# Patient Record
Sex: Male | Born: 1990 | Race: White | Hispanic: No | Marital: Married | Smoking: Current some day smoker
Health system: Southern US, Community
[De-identification: ages and names within clinical notes are randomized; demographics above are authoritative.]

---

## 2019-09-05 ENCOUNTER — Encounter (HOSPITAL_COMMUNITY)
Admission: EM | Disposition: A | Discharge status: Home / Self Care | Payer: Self-pay | Source: Home / Self Care | Attending: Emergency Medicine

## 2019-09-05 ENCOUNTER — Other Ambulatory Visit: Payer: Self-pay

## 2019-09-05 ENCOUNTER — Emergency Department (HOSPITAL_COMMUNITY): Payer: No Typology Code available for payment source | Admitting: Certified Registered Nurse Anesthetist

## 2019-09-05 ENCOUNTER — Ambulatory Visit (HOSPITAL_COMMUNITY)
Admission: EM | Admit: 2019-09-05 | Discharge: 2019-09-05 | Disposition: A | Discharge status: Home / Self Care | Payer: No Typology Code available for payment source | Attending: Emergency Medicine | Admitting: Emergency Medicine

## 2019-09-05 ENCOUNTER — Emergency Department (HOSPITAL_COMMUNITY): Payer: No Typology Code available for payment source

## 2019-09-05 ENCOUNTER — Encounter (HOSPITAL_COMMUNITY): Payer: Self-pay

## 2019-09-05 DIAGNOSIS — S0532XA Ocular laceration without prolapse or loss of intraocular tissue, left eye, initial encounter: Secondary | ICD-10-CM

## 2019-09-05 DIAGNOSIS — S0522XA Ocular laceration and rupture with prolapse or loss of intraocular tissue, left eye, initial encounter: Secondary | ICD-10-CM | POA: Diagnosis not present

## 2019-09-05 DIAGNOSIS — W25XXXA Contact with sharp glass, initial encounter: Secondary | ICD-10-CM | POA: Diagnosis not present

## 2019-09-05 DIAGNOSIS — F17200 Nicotine dependence, unspecified, uncomplicated: Secondary | ICD-10-CM | POA: Diagnosis not present

## 2019-09-05 DIAGNOSIS — Z20822 Contact with and (suspected) exposure to covid-19: Secondary | ICD-10-CM | POA: Diagnosis not present

## 2019-09-05 HISTORY — PX: RUPTURED GLOBE EXPLORATION AND REPAIR: SHX2366

## 2019-09-05 HISTORY — PX: ANTERIOR VITRECTOMY: SHX1173

## 2019-09-05 LAB — RESPIRATORY PANEL BY RT PCR (FLU A&B, COVID)
Influenza A by PCR: NEGATIVE
Influenza B by PCR: NEGATIVE
SARS Coronavirus 2 by RT PCR: NEGATIVE

## 2019-09-05 LAB — POC SARS CORONAVIRUS 2 AG -  ED: SARS Coronavirus 2 Ag: NEGATIVE

## 2019-09-05 SURGERY — REPAIR, RUPTURE, GLOBE
Anesthesia: General | Site: Eye | Laterality: Left

## 2019-09-05 MED ORDER — SODIUM CHLORIDE (PF) 0.9 % IJ SOLN
INTRAMUSCULAR | Status: AC
  Filled 2019-09-05: qty 10

## 2019-09-05 MED ORDER — ACETAMINOPHEN 500 MG PO TABS
1000.0000 mg | ORAL_TABLET | Freq: Once | ORAL | Status: AC
  Administered 2019-09-05: 16:00:00 1000 mg via ORAL
  Filled 2019-09-05: qty 2

## 2019-09-05 MED ORDER — FENTANYL CITRATE (PF) 100 MCG/2ML IJ SOLN: 25.0000 ug | INTRAMUSCULAR | Status: DC | PRN

## 2019-09-05 MED ORDER — LIDOCAINE 2% (20 MG/ML) 5 ML SYRINGE
INTRAMUSCULAR | Status: DC | PRN
  Administered 2019-09-05: 50 mg via INTRAVENOUS

## 2019-09-05 MED ORDER — FENTANYL CITRATE (PF) 100 MCG/2ML IJ SOLN
INTRAMUSCULAR | Status: DC | PRN
  Administered 2019-09-05 (×2): 50 ug via INTRAVENOUS
  Administered 2019-09-05: 100 ug via INTRAVENOUS
  Administered 2019-09-05: 50 ug via INTRAVENOUS

## 2019-09-05 MED ORDER — BSS PLUS IO SOLN
INTRAOCULAR | Status: AC
  Filled 2019-09-05: qty 500

## 2019-09-05 MED ORDER — FLUORESCEIN SODIUM 1 MG OP STRP
1.0000 | ORAL_STRIP | Freq: Once | OPHTHALMIC | Status: AC
  Administered 2019-09-05: 12:00:00 1 via OPHTHALMIC
  Filled 2019-09-05: qty 1

## 2019-09-05 MED ORDER — LIDOCAINE 2% (20 MG/ML) 5 ML SYRINGE
INTRAMUSCULAR | Status: AC
  Filled 2019-09-05: qty 5

## 2019-09-05 MED ORDER — NA CHONDROIT SULF-NA HYALURON 40-30 MG/ML IO SOLN
INTRAOCULAR | Status: DC | PRN
  Administered 2019-09-05: 0.5 mL via INTRAOCULAR

## 2019-09-05 MED ORDER — BSS IO SOLN
INTRAOCULAR | Status: AC
  Filled 2019-09-05: qty 30

## 2019-09-05 MED ORDER — BSS PLUS IO SOLN
INTRAOCULAR | Status: DC | PRN
  Administered 2019-09-05: 1 via INTRAOCULAR

## 2019-09-05 MED ORDER — TETRACAINE HCL 0.5 % OP SOLN
2.0000 [drp] | Freq: Once | OPHTHALMIC | Status: AC
  Administered 2019-09-05: 2 [drp] via OPHTHALMIC
  Filled 2019-09-05: qty 4

## 2019-09-05 MED ORDER — FENTANYL CITRATE (PF) 250 MCG/5ML IJ SOLN
INTRAMUSCULAR | Status: AC
  Filled 2019-09-05: qty 5

## 2019-09-05 MED ORDER — HYDROCODONE-ACETAMINOPHEN 5-325 MG PO TABS: 1.0000 | ORAL_TABLET | Freq: Four times a day (QID) | ORAL | 0 refills | Status: AC | PRN

## 2019-09-05 MED ORDER — EPINEPHRINE PF 1 MG/ML IJ SOLN
INTRAMUSCULAR | Status: AC
  Filled 2019-09-05: qty 1

## 2019-09-05 MED ORDER — LIDOCAINE HCL 2 % IJ SOLN
INTRAMUSCULAR | Status: AC
  Filled 2019-09-05: qty 20

## 2019-09-05 MED ORDER — ACETYLCHOLINE CHLORIDE 20 MG IO SOLR
INTRAOCULAR | Status: DC | PRN
  Administered 2019-09-05: 10 mg via INTRAOCULAR

## 2019-09-05 MED ORDER — BSS IO SOLN
INTRAOCULAR | Status: AC
  Filled 2019-09-05: qty 15

## 2019-09-05 MED ORDER — STERILE WATER FOR IRRIGATION IR SOLN
Status: DC | PRN
  Administered 2019-09-05: 1000 mL

## 2019-09-05 MED ORDER — SODIUM CHLORIDE 0.9 % IV SOLN: INTRAVENOUS | Status: DC

## 2019-09-05 MED ORDER — DEXAMETHASONE SODIUM PHOSPHATE 10 MG/ML IJ SOLN
INTRAMUSCULAR | Status: DC | PRN
  Administered 2019-09-05: 10 mg via INTRAVENOUS

## 2019-09-05 MED ORDER — CIPROFLOXACIN IN D5W 400 MG/200ML IV SOLN
400.0000 mg | Freq: Once | INTRAVENOUS | Status: AC
  Administered 2019-09-05: 14:00:00 400 mg via INTRAVENOUS
  Filled 2019-09-05: qty 200

## 2019-09-05 MED ORDER — HYPROMELLOSE (GONIOSCOPIC) 2.5 % OP SOLN
OPHTHALMIC | Status: AC
  Filled 2019-09-05: qty 15

## 2019-09-05 MED ORDER — PROVISC 10 MG/ML IO SOLN
INTRAOCULAR | Status: DC | PRN
  Administered 2019-09-05: .85 mL via INTRAOCULAR

## 2019-09-05 MED ORDER — ACETAZOLAMIDE SODIUM 500 MG IJ SOLR
INTRAMUSCULAR | Status: AC
  Filled 2019-09-05: qty 500

## 2019-09-05 MED ORDER — DEXAMETHASONE SODIUM PHOSPHATE 10 MG/ML IJ SOLN
INTRAMUSCULAR | Status: AC
  Filled 2019-09-05: qty 1

## 2019-09-05 MED ORDER — ACETYLCHOLINE CHLORIDE 20 MG IO SOLR
INTRAOCULAR | Status: AC
  Filled 2019-09-05: qty 1

## 2019-09-05 MED ORDER — MIDAZOLAM HCL 2 MG/2ML IJ SOLN
INTRAMUSCULAR | Status: DC | PRN
  Administered 2019-09-05: 2 mg via INTRAVENOUS

## 2019-09-05 MED ORDER — ONDANSETRON HCL 4 MG/2ML IJ SOLN
INTRAMUSCULAR | Status: DC | PRN
  Administered 2019-09-05: 4 mg via INTRAVENOUS

## 2019-09-05 MED ORDER — BACITRACIN-POLYMYXIN B 500-10000 UNIT/GM OP OINT
TOPICAL_OINTMENT | OPHTHALMIC | Status: AC
  Filled 2019-09-05: qty 3.5

## 2019-09-05 MED ORDER — MIDAZOLAM HCL 2 MG/2ML IJ SOLN
INTRAMUSCULAR | Status: AC
  Filled 2019-09-05: qty 2

## 2019-09-05 MED ORDER — CIPROFLOXACIN HCL 500 MG PO TABS: 500.0000 mg | ORAL_TABLET | Freq: Two times a day (BID) | ORAL | 0 refills | Status: AC

## 2019-09-05 MED ORDER — BUPIVACAINE HCL (PF) 0.75 % IJ SOLN
INTRAMUSCULAR | Status: AC
  Filled 2019-09-05: qty 10

## 2019-09-05 MED ORDER — SODIUM HYALURONATE 10 MG/ML IO SOLN
INTRAOCULAR | Status: AC
  Filled 2019-09-05: qty 0.85

## 2019-09-05 MED ORDER — PROPOFOL 10 MG/ML IV BOLUS
INTRAVENOUS | Status: DC | PRN
  Administered 2019-09-05: 160 mg via INTRAVENOUS
  Administered 2019-09-05: 20 mg via INTRAVENOUS

## 2019-09-05 MED ORDER — BSS IO SOLN
INTRAOCULAR | Status: DC | PRN
  Administered 2019-09-05 (×6): 15 mL via INTRAOCULAR

## 2019-09-05 MED ORDER — TRIAMCINOLONE ACETONIDE 40 MG/ML IJ SUSP
INTRAMUSCULAR | Status: AC
  Filled 2019-09-05: qty 5

## 2019-09-05 MED ORDER — PROPOFOL 10 MG/ML IV BOLUS
INTRAVENOUS | Status: AC
  Filled 2019-09-05: qty 20

## 2019-09-05 MED ORDER — CEFTAZIDIME 1 G IJ SOLR
INTRAMUSCULAR | Status: AC
  Filled 2019-09-05: qty 1

## 2019-09-05 MED ORDER — ROCURONIUM BROMIDE 10 MG/ML (PF) SYRINGE
PREFILLED_SYRINGE | INTRAVENOUS | Status: DC | PRN
  Administered 2019-09-05 (×2): 20 mg via INTRAVENOUS
  Administered 2019-09-05: 70 mg via INTRAVENOUS
  Administered 2019-09-05 (×2): 10 mg via INTRAVENOUS

## 2019-09-05 MED ORDER — SUGAMMADEX SODIUM 200 MG/2ML IV SOLN
INTRAVENOUS | Status: DC | PRN
  Administered 2019-09-05: 200 mg via INTRAVENOUS

## 2019-09-05 MED ORDER — NA CHONDROIT SULF-NA HYALURON 40-30 MG/ML IO SOLN
INTRAOCULAR | Status: AC
  Filled 2019-09-05: qty 0.5

## 2019-09-05 MED ORDER — ROCURONIUM BROMIDE 10 MG/ML (PF) SYRINGE
PREFILLED_SYRINGE | INTRAVENOUS | Status: AC
  Filled 2019-09-05: qty 10

## 2019-09-05 MED ORDER — MOXIFLOXACIN HCL 0.5 % OP SOLN: 1.0000 [drp] | Freq: Four times a day (QID) | OPHTHALMIC | 0 refills | Status: AC

## 2019-09-05 MED ORDER — TOBRAMYCIN-DEXAMETHASONE 0.3-0.1 % OP SUSP
OPHTHALMIC | Status: AC
  Filled 2019-09-05: qty 2.5

## 2019-09-05 MED ORDER — STERILE WATER FOR INJECTION IJ SOLN
INTRAMUSCULAR | Status: AC
  Filled 2019-09-05: qty 20

## 2019-09-05 MED ORDER — PREDNISOLONE ACETATE 1 % OP SUSP: 1.0000 [drp] | Freq: Four times a day (QID) | OPHTHALMIC | 0 refills | Status: AC

## 2019-09-05 MED ORDER — TOBRAMYCIN-DEXAMETHASONE 0.3-0.1 % OP SUSP
OPHTHALMIC | Status: DC | PRN
  Administered 2019-09-05: 1 [drp] via OPHTHALMIC

## 2019-09-05 MED ORDER — IOHEXOL 300 MG/ML  SOLN
75.0000 mL | Freq: Once | INTRAMUSCULAR | Status: AC | PRN
  Administered 2019-09-05: 13:00:00 75 mL via INTRAVENOUS

## 2019-09-05 MED ORDER — HYALURONIDASE HUMAN 150 UNIT/ML IJ SOLN
INTRAMUSCULAR | Status: AC
  Filled 2019-09-05: qty 1

## 2019-09-05 MED ORDER — POLYMYXIN B SULFATE 500000 UNITS IJ SOLR
INTRAMUSCULAR | Status: AC
  Filled 2019-09-05: qty 500000

## 2019-09-05 MED ORDER — ATROPINE SULFATE 1 % OP SOLN
OPHTHALMIC | Status: AC
  Filled 2019-09-05: qty 5

## 2019-09-05 SURGICAL SUPPLY — 86 items
ACCESSORY FRAGMATOME (MISCELLANEOUS) IMPLANT
APPLICATOR DR MATTHEWS STRL (MISCELLANEOUS) ×3 IMPLANT
BAG URINE DRAINAGE (UROLOGICAL SUPPLIES) IMPLANT
BAND WRIST GAS GREEN (MISCELLANEOUS) IMPLANT
BETADINE 5% OPHTHALMIC (OPHTHALMIC) ×1 IMPLANT
BLADE EYE CATARACT 19 1.4 BEAV (BLADE) IMPLANT
BLADE MVR KNIFE 19G (BLADE) IMPLANT
BLADE MVR KNIFE 20G (BLADE) ×3 IMPLANT
BLADE SURG 15 STRL LF DISP TIS (BLADE) IMPLANT
BLADE SURG 15 STRL SS (BLADE)
BNDG EYE OVAL (GAUZE/BANDAGES/DRESSINGS) ×3 IMPLANT
CANNULA ANT CHAM MAIN (OPHTHALMIC RELATED) ×3 IMPLANT
CANNULA ANTERIOR CHAMBER 27GA (MISCELLANEOUS) ×3 IMPLANT
CANNULA SUBRETINAL FLUID 20G (BLADE) IMPLANT
CANNULA TROCAR 25 GA VLV (OPHTHALMIC) ×3 IMPLANT
CATH FOLEY 2WAY SLVR  5CC 16FR (CATHETERS)
CATH FOLEY 2WAY SLVR 5CC 16FR (CATHETERS) IMPLANT
CLOSURE WOUND 1/2 X4 (GAUZE/BANDAGES/DRESSINGS) ×1
CORD BIPOLAR FORCEPS 12FT (ELECTRODE) IMPLANT
COTTONBALL LRG STERILE PKG (GAUZE/BANDAGES/DRESSINGS) IMPLANT
COVER MAYO STAND STRL (DRAPES) IMPLANT
COVER SURGICAL LIGHT HANDLE (MISCELLANEOUS) ×3 IMPLANT
COVER WAND RF STERILE (DRAPES) IMPLANT
DRAPE OPHTHALMIC 77X100 STRL (CUSTOM PROCEDURE TRAY) ×3 IMPLANT
ERASER HMR WETFIELD 23G BP (MISCELLANEOUS) IMPLANT
FILTER BLUE MILLIPORE (MISCELLANEOUS) IMPLANT
GAS OPHTHALMIC (MISCELLANEOUS) IMPLANT
GAS WRIST BAND GREEN (MISCELLANEOUS)
GLOVE SS BIOGEL STRL SZ 6.5 (GLOVE) ×1 IMPLANT
GLOVE SUPERSENSE BIOGEL SZ 6.5 (GLOVE) ×2
GLOVE SURG 8.5 LATEX PF (GLOVE) ×3 IMPLANT
GOWN STRL REUS W/ TWL LRG LVL3 (GOWN DISPOSABLE) ×3 IMPLANT
GOWN STRL REUS W/TWL LRG LVL3 (GOWN DISPOSABLE) ×6
KIT BASIN OR (CUSTOM PROCEDURE TRAY) ×3 IMPLANT
KIT PERFLUORON PROCEDURE 5ML (MISCELLANEOUS) IMPLANT
KIT TURNOVER KIT B (KITS) ×3 IMPLANT
KNIFE CRESCENT 1.75 EDGEAHEAD (BLADE) IMPLANT
KNIFE GRIESHABER SHARP 2.5MM (MISCELLANEOUS) IMPLANT
MARKER SKIN DUAL TIP RULER LAB (MISCELLANEOUS) IMPLANT
NEEDLE 18GX1X1/2 (RX/OR ONLY) (NEEDLE) ×3 IMPLANT
NEEDLE 25GX 5/8IN NON SAFETY (NEEDLE) ×3 IMPLANT
NEEDLE HYPO 30X.5 LL (NEEDLE) ×3 IMPLANT
NEEDLE PRECISIONGLIDE 27X1.5 (NEEDLE) IMPLANT
NS IRRIG 1000ML POUR BTL (IV SOLUTION) ×3 IMPLANT
OPHTHALMIC BETADINE 5% (OPHTHALMIC) ×2
PACK VITRECTOMY CUSTOM (CUSTOM PROCEDURE TRAY) ×3 IMPLANT
PACK VITRECTOMY PIC MCHSVP (PACKS) IMPLANT
PAD ARMBOARD 7.5X6 YLW CONV (MISCELLANEOUS) ×6 IMPLANT
PAK PIK VITRECTOMY CVS 25GA (OPHTHALMIC) ×3 IMPLANT
PROBE ANTERIOR VITRECTOR (OPHTHALMIC) ×3 IMPLANT
PROBE CONSTELLATION 25 GA PLUS (OPHTHALMIC) IMPLANT
PROBE LASER 20G CVD ENDOCULAR (MISCELLANEOUS) IMPLANT
PROBE LASER LIGHT 20GA (MISCELLANEOUS) IMPLANT
REPL STRA BRUSH NEEDLE (NEEDLE) IMPLANT
RESERVOIR BACK FLUSH (MISCELLANEOUS) IMPLANT
ROLLS DENTAL (MISCELLANEOUS) ×6 IMPLANT
Resure Sealant ×6 IMPLANT
SCRAPER DIAMOND DUST MEMBRANE (MISCELLANEOUS) IMPLANT
SET VGFI TUBING 8065808002 (SET/KITS/TRAYS/PACK) IMPLANT
SHIELD EYE LENSE ONLY DISP (GAUZE/BANDAGES/DRESSINGS) ×3 IMPLANT
SPEAR EYE SURG WECK-CEL (MISCELLANEOUS) ×15 IMPLANT
STOPCOCK 4 WAY LG BORE MALE ST (IV SETS) IMPLANT
STRIP CLOSURE SKIN 1/2X4 (GAUZE/BANDAGES/DRESSINGS) ×2 IMPLANT
SUT CHROMIC 7 0 TG140 8 (SUTURE) IMPLANT
SUT ETHILON 10 0 BV100 4 (SUTURE) IMPLANT
SUT ETHILON 10 0 BV75 4 (SUTURE) IMPLANT
SUT ETHILON 10 0 CS140 6 (SUTURE) ×6 IMPLANT
SUT ETHILON 8 0 TG100 8 (SUTURE) ×3 IMPLANT
SUT ETHILON 9 0 TG140 8 (SUTURE) ×3 IMPLANT
SUT SILK 2 0 (SUTURE)
SUT SILK 2-0 18XBRD TIE 12 (SUTURE) IMPLANT
SUT VICRYL 7 0 TG140 8 (SUTURE) IMPLANT
SWAB COLLECTION DEVICE MRSA (MISCELLANEOUS) IMPLANT
SWAB CULTURE ESWAB REG 1ML (MISCELLANEOUS) IMPLANT
SYR 10ML LL (SYRINGE) IMPLANT
SYR 20ML LL LF (SYRINGE) ×3 IMPLANT
SYR 50ML SLIP (SYRINGE) IMPLANT
SYR 5ML LL (SYRINGE) IMPLANT
SYR BULB 3OZ (MISCELLANEOUS) ×3 IMPLANT
SYR CONTROL 10ML LL (SYRINGE) ×3 IMPLANT
SYR TB 1ML LUER SLIP (SYRINGE) ×3 IMPLANT
TOWEL GREEN STERILE FF (TOWEL DISPOSABLE) ×9 IMPLANT
TUBING HIGH PRESS EXTEN 6IN (TUBING) IMPLANT
VITREORETINAL VISCODISSEC (MISCELLANEOUS) IMPLANT
WATER STERILE IRR 1000ML POUR (IV SOLUTION) ×3 IMPLANT
WIPE INSTRUMENT VISIWIPE 73X73 (MISCELLANEOUS) ×3 IMPLANT

## 2019-09-05 NOTE — ED Provider Notes (Signed)
Nathaniel Briggs is a 29 y.o. male who was transferred from [facility] for evaluation of possible globe rupture after patient got shattered glass in his eye.  Please see Dr. Christoper Fabian note for full details.  Dr. Cathey Endow with ophthalmology was consulted by Dr. Fredderick Phenix, he requests that the patient have CT of the orbits once he has arrived here at [facility].   Upon arrival to the department Dr. Cathey Endow is already at bedside speaking with patient.  Dr. Cathey Endow plans to take patient to the OR, surgery is scheduled for 4 PM.  In the meantime he would like patient to receive IV antibiotics and request that the patient be prescribed outpatient oral antibiotics as well as Vigamox drops, Pred forte drops, and pain medication.    Initially Dr. Cathey Endow had asked for IV and oral moxifloxacin but this is not on formulary here at the hospital, spoke with Dr. Cathey Endow again and he is okay with using IV and oral Cipro.  Prescriptions have been sent into the patient's pharmacy and will be ready for him for discharge after surgery.  CT ORBITS W CONTRAST  Result Date: 09/05/2019 CLINICAL DATA:  Left orbital injury.  Rule out glass foreign body EXAM: CT ORBITS WITH CONTRAST TECHNIQUE: Multidetector CT images was performed according to the standard protocol following intravenous contrast administration. CONTRAST:  65mL OMNIPAQUE IOHEXOL 300 MG/ML  SOLN COMPARISON:  None. FINDINGS: Orbits: No radiopaque glass foreign body in the orbit. There is a small gas bubble along the lateral cornea on the left which could be a corneal laceration. The lens is normal in position bilaterally. The posterior chamber is normal without hemorrhage. No globe rupture identified. Orbital soft tissues otherwise normal. Visualized sinuses: Clear Soft tissues: No significant soft tissue swelling or foreign body in the face. Limited intracranial: Negative IMPRESSION: No radiopaque foreign body in the orbit or soft tissues Small gas bubble along the anterior cornea  laterally on the left which could represent a corneal laceration. Electronically Signed   By: Marlan Palau M.D.   On: 09/05/2019 12:59   CT scan without evidence of foreign body, small gas bubble along the anterior cornea laterally which likely represents a corneal laceration, no obvious evidence of globe rupture on CT.  Patient comfortable while waiting to go to the OR at 4 PM with Dr. Cathey Endow.  He will be discharged from postop.  Final diagnoses:  Ruptured globe of left eye, initial encounter    ED Discharge Orders         Ordered    moxifloxacin (VIGAMOX) 0.5 % ophthalmic solution  4 times daily     09/05/19 1320    prednisoLONE acetate (PRED FORTE) 1 % ophthalmic suspension  4 times daily     09/05/19 1320    ciprofloxacin (CIPRO) 500 MG tablet  2 times daily     09/05/19 1320    HYDROcodone-acetaminophen (NORCO) 5-325 MG tablet  Every 6 hours PRN     09/05/19 1320            Dartha Lodge, [place] 09/05/19 1500    Jacalyn Lefevre, MD 09/11/19 1201

## 2019-09-05 NOTE — ED Notes (Signed)
Visual acuity R eye 20/25, patient does have contact in. Unable to read any numbers with L eye, states he took his contact out, everything is blurry.

## 2019-09-05 NOTE — Brief Op Note (Signed)
09/05/2019  7:24 PM  PATIENT:  Nathaniel Briggs  29 y.o. male  PRE-OPERATIVE DIAGNOSIS:  ruptured globe  POST-OPERATIVE DIAGNOSIS:  ruptured globe  PROCEDURE:  Procedure(s): REPAIR OF RUPTURED GLOBE (Left) Anterior Vitrectomy (Left)  SURGEON:  Surgeon(s) and Role:    Sinda Du, MD - Primary  PHYSICIAN ASSISTANT:   ASSISTANTS: none   ANESTHESIA:   general  EBL:  None   BLOOD ADMINISTERED:none  DRAINS: none   LOCAL MEDICATIONS USED:  LIDOCAINE   SPECIMEN:  No Specimen  DISPOSITION OF SPECIMEN:  N/A  COUNTS:  YES  TOURNIQUET:  * No tourniquets in log *  DICTATION: .Note written in EPIC  PLAN OF CARE: Discharge to home after PACU  PATIENT DISPOSITION:  PACU - hemodynamically stable.   Delay start of Pharmacological VTE agent (>24hrs) due to surgical blood loss or risk of bleeding: not applicable

## 2019-09-05 NOTE — H&P (Addendum)
OPHTHALMOLOGY CONSULT NOTE  Date: 09/05/19 Time: 12:32 PM  Patient Name: Nathaniel Briggs  DOB: 03/07/1991 MRN: [id number]  Reason for Consult:  Glass in eye os  HPI: This is a 29 y.o. male who was moving an entertainment center earlier today when a glass screen broke and hit him in the left eye. The patient noted an immidiate reduction in his vision and presented to [facility]. After reviewing photos, patient was transferred to [facility] for further evaluation and surgery.   Prior to Admission medications   Not on File    No past medical history on file.  family history is not on file.  Social History   Occupational History  . Not on file  Tobacco Use  . Smoking status: Not on file  Substance and Sexual Activity  . Alcohol use: Not on file  . Drug use: Not on file  . Sexual activity: Not on file    No Known Allergies  ROS: Positive as above, otherwise negative.  EXAM:  Mental Status: A&O x 3   Base Exam: Right Eye Left Eye  Visual Acuity (At near)  LP  IOP (Tonopen) 20/25 Defered  Pupillary Exam APD by reverse   Motility Full   Confrontation VF Full     Anterior Segment Exam    Lids/Lashes WNL WNL  Conjuctiva White and Quiet Temporal conjuctival laceration with iris prolapse  Cornea Clear Clear  Anterior Chamber Deep and Quiet Total hyphema iris peaked temporally  Iris Round, Reactive As above  Lens Clear No view  Vitreous WNL    Physical exam: Pulm: Normal excursion Cardio: RRR Abdomen: Soft, NT Neuro: Non focal   Radiographic Studies Reviewed: None No results found.  Assessment and Recommendation: 1. Ruptured globe with iris prolapse: Rec ruptured globe repair after RBAI discussed.   -Give 400mg  IV Moxifloxacin in ER  discharge with QID Moxifloxacin ophthalmic, pred forte 1% qid, as well as 400mg  po Moxifloxacin qd x 10 days. Ok for cipro if not Moxifloxacin.   -shield over eye   Please call with any questions.  MD [facility]  Ophthalmology [phone]

## 2019-09-05 NOTE — ED Provider Notes (Signed)
[facility]-EMERGENCY DEPT Provider Note   CSN: [id number] Arrival date & time: 09/05/19  1032     History No chief complaint on file.   Kelan Pritt is a 29 y.o. male.  Patient is a 29 year old male who presents after an eye injury.  He was moving a glass entertainment center that had black glass and when he laid the glass on the bed, it shattered and he felt like a piece went into his eye.  He did have a contact in which has subsequently been removed.  He said his vision is blurry in his left eye has been watering.  He has ongoing pain to his left eye.  He denies any other injuries.        No past medical history on file.  There are no problems to display for this patient.    The histories are not reviewed yet. Please review them in the "History" navigator section and refresh this SmartLink.     No family history on file.  Social History   Tobacco Use  . Smoking status: Not on file  Substance Use Topics  . Alcohol use: Not on file  . Drug use: Not on file    Home Medications Prior to Admission medications   Not on File    Allergies    Patient has no known allergies.  Review of Systems   Review of Systems  Constitutional: Negative for fever.  HENT: Negative.   Eyes: Positive for pain, discharge and visual disturbance.  Respiratory: Negative for shortness of breath.   Gastrointestinal: Negative for nausea and vomiting.  Genitourinary: Negative.   Musculoskeletal: Negative for arthralgias, myalgias and neck pain.  Skin: Positive for wound.  Neurological: Negative for headaches.  Psychiatric/Behavioral: Negative for confusion.    Physical Exam Updated Vital Signs BP (!) 156/110 (BP Location: Left Arm)   Pulse 82   Temp 98.6 F (37 C) (Oral)   Resp 16   Ht 6' (1.829 m)   Wt 113.4 kg   SpO2 98%   BMI 33.91 kg/m   Physical Exam HENT:     Head: Normocephalic and atraumatic.     Comments: Small superficial abrasion to the  skin just lateral to the left eye    Nose: Nose normal.  Eyes:     Extraocular Movements: Extraocular movements intact.     Pupils: Pupils are equal, round, and reactive to light.     Comments: Patient has a small puncture/laceration to the sclera at the 3 o'clock position of the left eye, just lateral to the cornea.  There is a small hematoma present.  As I was trying to assess whether there is a piece of glass in the wound, it does seem to reaccumulate with fluid, concerning for a deeper puncture into the globe  Cardiovascular:     Rate and Rhythm: Normal rate.  Pulmonary:     Effort: Pulmonary effort is normal.  Abdominal:     General: Abdomen is flat.     Tenderness: There is no abdominal tenderness.  Musculoskeletal:        General: Normal range of motion.     Cervical back: Neck supple.  Skin:    General: Skin is warm and dry.  Neurological:     General: No focal deficit present.     Mental Status: He is alert.     ED Results / Procedures / Treatments   Labs (all labs ordered are listed, but only abnormal results are  displayed) Labs Reviewed  RESPIRATORY PANEL BY RT PCR (FLU A&B, COVID)    EKG None  Radiology No results found.  Procedures Procedures (including critical care time)  Medications Ordered in ED Medications  fluorescein ophthalmic strip 1 strip (1 strip Left Eye Given 09/05/19 1132)  tetracaine (PONTOCAINE) 0.5 % ophthalmic solution 2 drop (2 drops Left Eye Given 09/05/19 1132)    ED Course  I have reviewed the triage vital signs and the nursing notes.  Pertinent labs & imaging results that were available during my care of the patient were reviewed by me and considered in my medical decision making (see chart for details).    MDM Rules/Calculators/A&P                      Patient is a 29 year old male who presents with concern for globe rupture. I spoke with Dr. Valetta Close with ophthalmology who has seen the picture of the eye and also was concerned  about a globe rupture. He request emergent transfer to [facility] emergency department where he probably will need operative management. He also request a CT of the orbits although he wants this to be done at [facility] versus [facility]. An eye protector was placed over the eye. 2 hour covid test pending.  Pt's manager to drive pt to [facility] ED.  I spoke to Dr. Jeanell Sparrow who also knows about patient. Patient was advised not to eat or drink anything and to go directly to the [facility] emergency department.  CRITICAL CARE Performed by: Malvin Johns Total critical care time: 30 minutes Critical care time was exclusive of separately billable procedures and treating other patients. Critical care was necessary to treat or prevent imminent or life-threatening deterioration. Critical care was time spent personally by me on the following activities: development of treatment plan with patient and/or surrogate as well as nursing, discussions with consultants, evaluation of patient's response to treatment, examination of patient, obtaining history from patient or surrogate, ordering and performing treatments and interventions, ordering and review of laboratory studies, ordering and review of radiographic studies, pulse oximetry and re-evaluation of patient's condition.  Final Clinical Impression(s) / ED Diagnoses Final diagnoses:  Ruptured globe of left eye, initial encounter    Rx / DC Orders ED Discharge Orders    None       Malvin Johns, MD 09/05/19 1154

## 2019-09-05 NOTE — ED Notes (Signed)
Consulting civil engineer at [employer] notified of patient's transfer.

## 2019-09-05 NOTE — Op Note (Signed)
Ophthalmology operative report: 09/05/2019  7:24 PM  PATIENT:  Nathaniel Briggs  29 y.o. male  PRE-OPERATIVE DIAGNOSIS:  ruptured globe  POST-OPERATIVE DIAGNOSIS:  ruptured globe  PROCEDURE:  Procedure(s): REPAIR OF RUPTURED GLOBE (Left) Anterior Vitrectomy (Left)  SURGEON:  Surgeon(s) and Role:    Sinda Du, MD - Primary  PHYSICIAN ASSISTANT:   ASSISTANTS: none   ANESTHESIA:   general  EBL:  None   BLOOD ADMINISTERED:none  DRAINS: none   LOCAL MEDICATIONS USED:  LIDOCAINE   SPECIMEN:  No Specimen  DISPOSITION OF SPECIMEN:  N/A  COUNTS:  YES  TOURNIQUET:  * No tourniquets in log *  DICTATION: .Note written in EPIC  PLAN OF CARE: Discharge to home after PACU  PATIENT DISPOSITION:  PACU - hemodynamically stable.   Delay start of Pharmacological VTE agent (>24hrs) due to surgical blood loss or risk of bleeding: not applicable         Description of Procedure:  After the risks benefits and alternative of surgery were discussed including guarded visual prognosis, risk of infection and likelihood of needing more surgery, the patient was taken to OR [address] where he was placed under general anesthesia. A time out was performed confirming the correct patient, correct eye and correct procedure. After induction the patient was rotated under the operating microscope and the prepped in the usual sterile ophthalmic fashion. Attention was turned to the eye and a paracentesis was created nasally. The patient had significant hyphema, but centrally visability was clear enough to allow for  Provisc to be injected into the eye and the iris sweeped from the wound. At that point a peritomy was created in the conjunctiva temporally to reveal the extent of the wound. The wound was C shaped and approximately 6 mm long. Despite viscoelastic Iris/uveal prolapse continued and the wound was closed gradually posterior to anterior to slowly close the gap and  limit prolapse. The iris required sweeping from the wound multiple times and Iris damage was noted despite care to avoid this. Despite this, The wound was gradually closed in interupted fashion using 9-0 nylon sutures and all attempts were made not to incarsorate the iris. Wound closure was completed and the wound was found to be water tight. Attention was turned to the anterior chamber and during hydration of the surgical incision vitreous was noted in [facility] given a clearer view after IA. Given this a limited anterior vitrectomy was performed to avoid vitreous incarceration. At this point all wounds were sutured as a second paracentesis was required at 10 oclock for the irrigation. Wounds were hydrated and all wounds including the rupture site were water tight with normal pressure. Resure ocular sealant was placed over the rupture site to ensure maintained closure. The peritomy was then closed. Tobradex was administered and  A patch and shield were placed on the eye.   The patient was discharged to pacu in stable condition and should start Oral ciprofloxacin, moxifloxacin and pred forte QID starting tonight as directed in the ED.

## 2019-09-05 NOTE — Transfer of Care (Signed)
Immediate Anesthesia Transfer of Care Note  Patient: Nathaniel Briggs  Procedure(s) Performed: REPAIR OF RUPTURED GLOBE (Left Eye) Anterior Vitrectomy (Left Eye)  Patient Location: PACU  Anesthesia Type:General  Level of Consciousness: drowsy and patient cooperative  Airway & Oxygen Therapy: Patient Spontanous Breathing  Post-op Assessment: Report given to RN and Post -op Vital signs reviewed and stable  Post vital signs: Reviewed and stable  Last Vitals:  Vitals Value Taken Time  BP    Temp    Pulse 94 09/05/19 1946  Resp 17 09/05/19 1946  SpO2 95 % 09/05/19 1946  Vitals shown include unvalidated device data.  Last Pain:  Vitals:   09/05/19 1556  TempSrc:   PainSc: 2       Patients Stated Pain Goal: 0 (09/05/19 1556)  Complications: No apparent anesthesia complications

## 2019-09-05 NOTE — Anesthesia Procedure Notes (Signed)
Procedure Name: Intubation Date/Time: 09/05/2019 4:23 PM Performed by: Barrington Ellison, CRNA Pre-anesthesia Checklist: Patient identified, Emergency Drugs available, Suction available and Patient being monitored Patient Re-evaluated:Patient Re-evaluated prior to induction Oxygen Delivery Method: Circle System Utilized Preoxygenation: Pre-oxygenation with 100% oxygen Induction Type: IV induction Ventilation: Mask ventilation without difficulty Laryngoscope Size: Mac and 4 Grade View: Grade I Tube type: Oral Tube size: 7.5 mm Number of attempts: 1 Airway Equipment and Method: Stylet and Oral airway Placement Confirmation: ETT inserted through vocal cords under direct vision,  positive ETCO2 and breath sounds checked- equal and bilateral Secured at: 23 cm Tube secured with: Tape Dental Injury: Teeth and Oropharynx as per pre-operative assessment

## 2019-09-05 NOTE — Consult Note (Signed)
OPHTHALMOLOGY CONSULT NOTE  Date: 09/05/19 Time: 12:32 PM  Patient Name: Nathaniel Briggs  DOB: Jan 17, 1991 MRN: [id number]  Reason for Consult:  Glass in eye os  HPI: This is a 29 y.o. male who was moving an entertainment center earlier today when a glass screen broke and hit him in the left eye. The patient noted an immidiate reduction in his vision and presented to [facility]. After reviewing photos, patient was transferred to [facility] for further evaluation and surgery.   Prior to Admission medications   Not on File    No past medical history on file.  family history is not on file.  Social History   Occupational History  . Not on file  Tobacco Use  . Smoking status: Not on file  Substance and Sexual Activity  . Alcohol use: Not on file  . Drug use: Not on file  . Sexual activity: Not on file    No Known Allergies  ROS: Positive as above, otherwise negative.  EXAM:  Mental Status: A&O x 3   Base Exam: Right Eye Left Eye  Visual Acuity (At near)  LP  IOP (Tonopen) 20/25 Defered  Pupillary Exam APD by reverse   Motility Full   Confrontation VF Full     Anterior Segment Exam    Lids/Lashes WNL WNL  Conjuctiva White and Quiet Temporal conjuctival laceration with iris prolapse  Cornea Clear Clear  Anterior Chamber Deep and Quiet Total hyphema iris peaked temporally  Iris Round, Reactive As above  Lens Clear No view  Vitreous WNL     Radiographic Studies Reviewed: None No results found.  Assessment and Recommendation: 1. Ruptured globe with iris prolapse: Rec ruptured globe repair after RBAI discussed.   -Give 400mg  IV Moxifloxacin in ER  discharge with QID Moxifloxacin ophthalmic as well as 400mg  po Moxifloxacin qd x 10 days.  -shield over eye   -GET ct orbits prior to surgery  Please call with any questions.  MD [facility] Ophthalmology [phone]

## 2019-09-05 NOTE — Anesthesia Preprocedure Evaluation (Addendum)
Anesthesia Evaluation  Patient identified by MRN, date of birth, ID band Patient awake    Reviewed: Allergy & Precautions, NPO status , Patient's Chart, lab work & pertinent test results  Airway Mallampati: II  TM Distance: >3 FB Neck ROM: Full    Dental  (+) Teeth Intact, Dental Advisory Given   Pulmonary Current Smoker and Patient abstained from smoking.,    breath sounds clear to auscultation       Cardiovascular negative cardio ROS   Rhythm:Regular Rate:Normal     Neuro/Psych negative neurological ROS  negative psych ROS   GI/Hepatic negative GI ROS, Neg liver ROS,   Endo/Other  negative endocrine ROS  Renal/GU negative Renal ROS  negative genitourinary   Musculoskeletal negative musculoskeletal ROS (+)   Abdominal Normal abdominal exam  (+)   Peds  Hematology negative hematology ROS (+)   Anesthesia Other Findings Ruptured globe OS from piece of glass  Reproductive/Obstetrics                            Anesthesia Physical Anesthesia Plan  ASA: I and emergent  Anesthesia Plan: General   Post-op Pain Management:    Induction: Intravenous  PONV Risk Score and Plan: 2 and Midazolam, Dexamethasone and Ondansetron  Airway Management Planned: Oral ETT  Additional Equipment:   Intra-op Plan:   Post-operative Plan: Extubation in OR  Informed Consent: I have reviewed the patients History and Physical, chart, labs and discussed the procedure including the risks, benefits and alternatives for the proposed anesthesia with the patient or authorized representative who has indicated his/her understanding and acceptance.     Dental advisory given  Plan Discussed with: CRNA  Anesthesia Plan Comments:         Anesthesia Quick Evaluation

## 2019-09-05 NOTE — ED Triage Notes (Signed)
Patient stated he was taking apart a glass entertainment center and placing it on the bed when it "exploded" into his eye. Reports black colored glass in eye, able to see out of L eye, but it is blurry.

## 2019-09-06 NOTE — Anesthesia Postprocedure Evaluation (Signed)
Anesthesia Post Note  Patient: Nathaniel Briggs  Procedure(s) Performed: REPAIR OF RUPTURED GLOBE (Left Eye) Anterior Vitrectomy (Left Eye)     Patient location during evaluation: PACU Anesthesia Type: General Level of consciousness: awake Pain management: pain level controlled Vital Signs Assessment: post-procedure vital signs reviewed and stable Cardiovascular status: stable Postop Assessment: no apparent nausea or vomiting Anesthetic complications: no    Last Vitals:  Vitals:   09/05/19 2030 09/05/19 2045  BP: 125/84 127/81  Pulse: 73 89  Resp: 17 15  Temp: 37 C   SpO2: 95% 96%    Last Pain:  Vitals:   09/05/19 2045  TempSrc:   PainSc: 0-No pain                 Charl Wellen

## 2019-09-10 ENCOUNTER — Encounter: Payer: Self-pay | Admitting: *Deleted

## 2021-05-17 IMAGING — CT CT ORBITS W/ CM
3 series · 14 of 47 positions shown, 16 images · IV contrast (omnipaque)
Comparison: None.

CLINICAL DATA: Left orbital injury.  Rule out glass foreign body

EXAM:
CT ORBITS WITH CONTRAST
TECHNIQUE: Multidetector CT images was performed according to the standard
protocol following intravenous contrast administration.
CONTRAST:  75mL OMNIPAQUE IOHEXOL 300 MG/ML  SOLN

[Series 3: facialbone 2.0 st · axial · 0.34mm/px · z∈[-215,-117]mm · 8 of 57 slices shown, 10 images]
[im 4/57  brain]
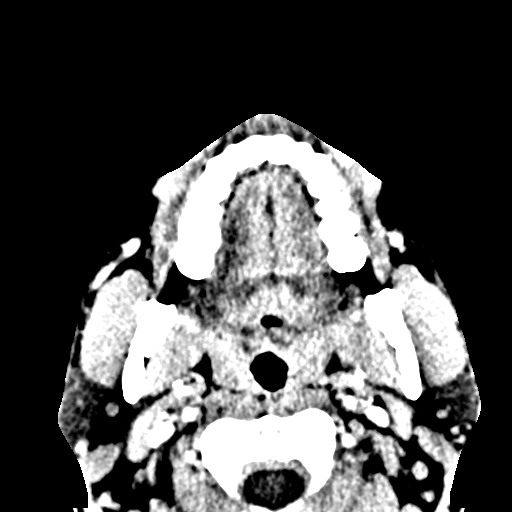
[im 4/57  bone]
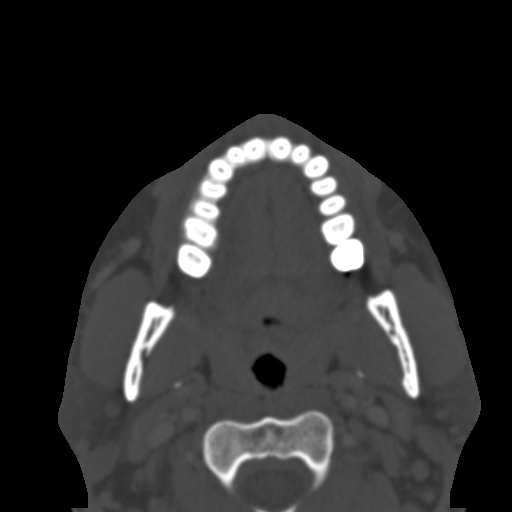
[im 12/57  bone]
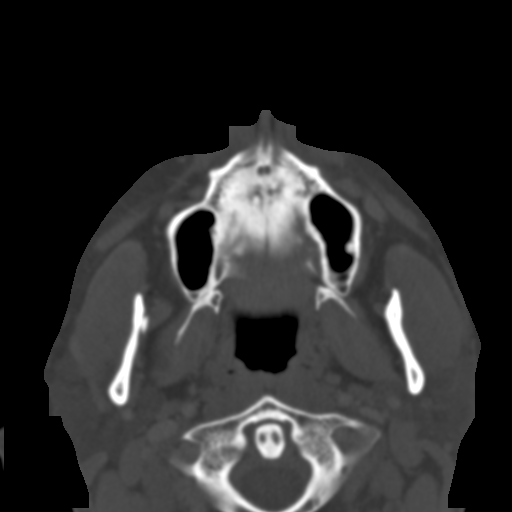
[im 18/57  bone]
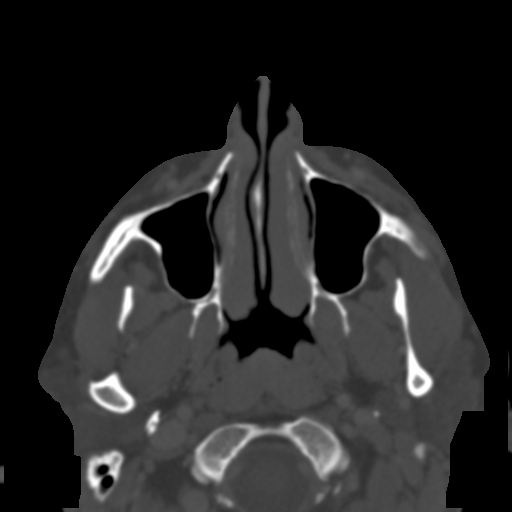
[im 26/57  bone]
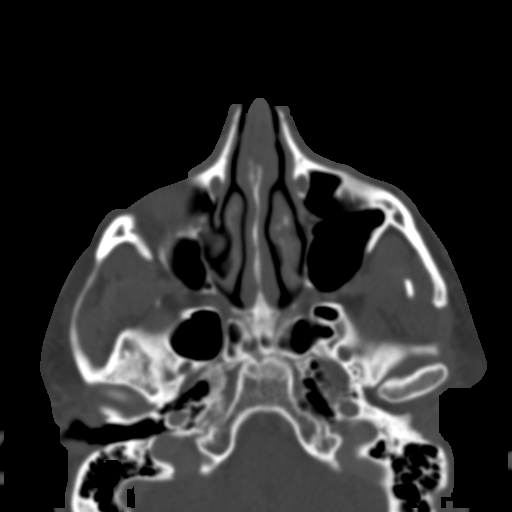
[im 31/57  brain]
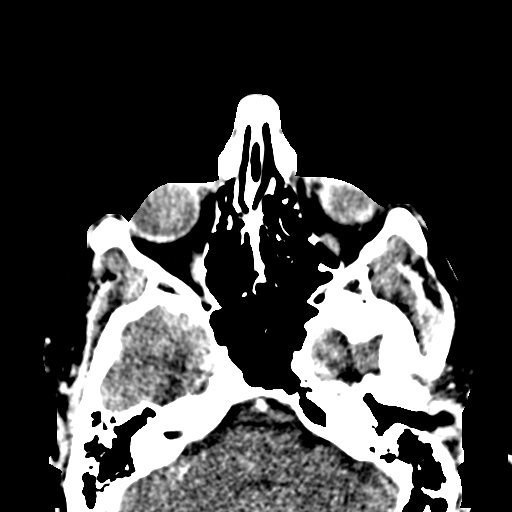
[im 31/57  bone]
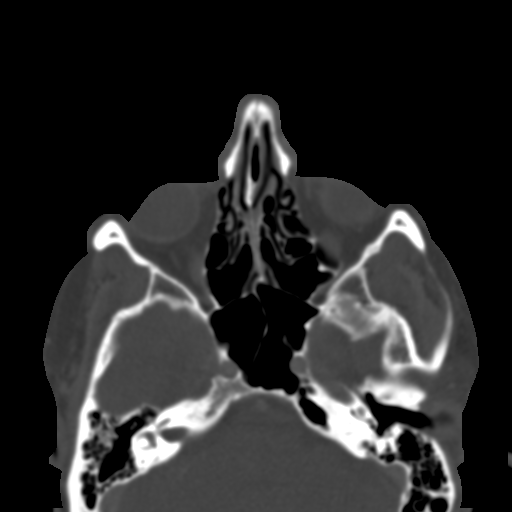
[im 39/57  bone]
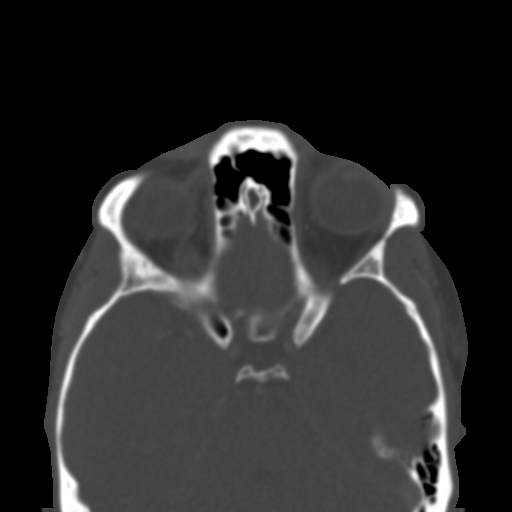
[im 45/57  bone]
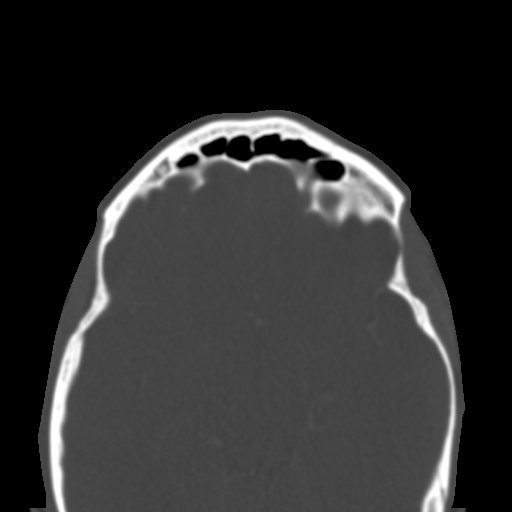
[im 53/57  bone]
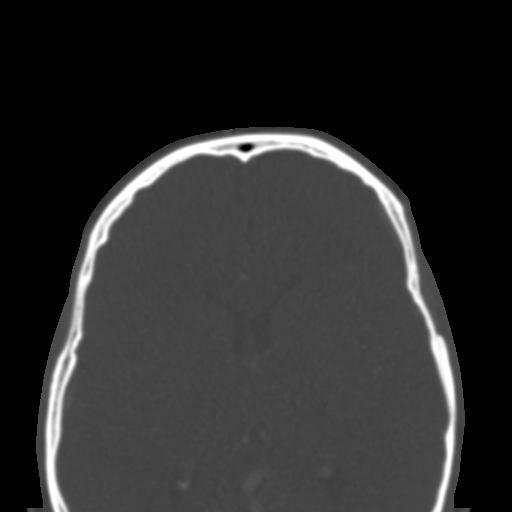

[Series 7: facialbone 2.0 cor st · coronal · 0.22mm/px · 3 of 79 slices shown]
[im 27/79  bone]
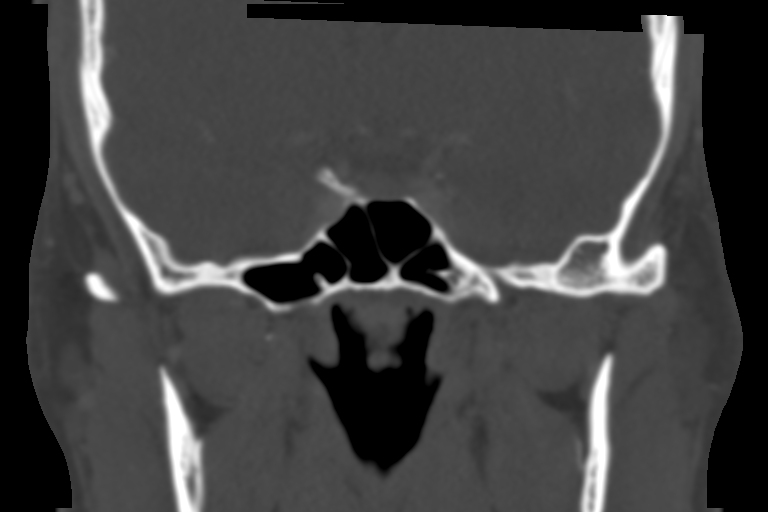
[im 35/79  bone]
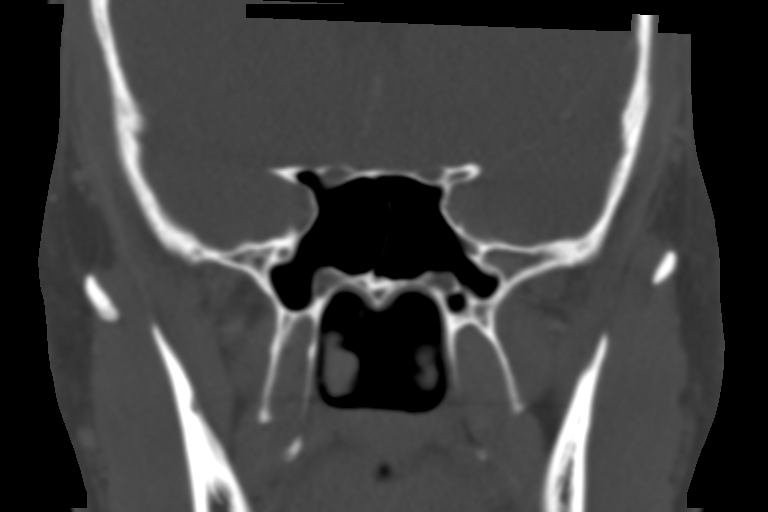
[im 44/79  bone]
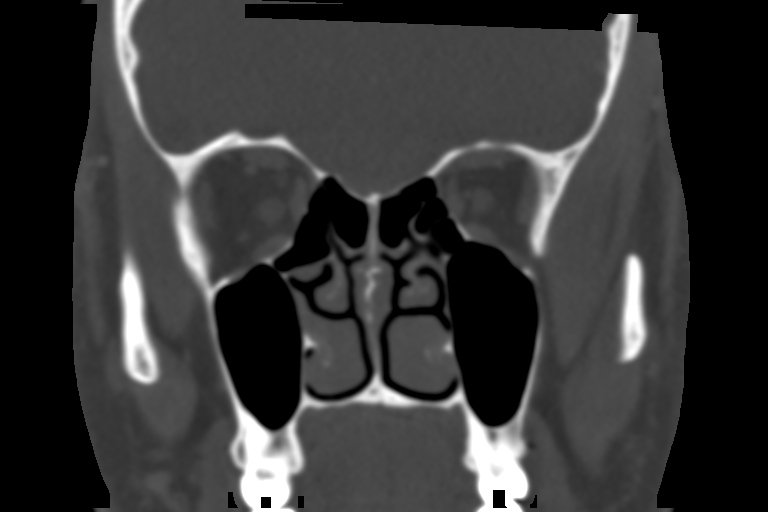

[Series 8: facialbone 2.0 sag st · sagittal · 0.22mm/px · 3 of 85 slices shown]
[im 29/85  bone]
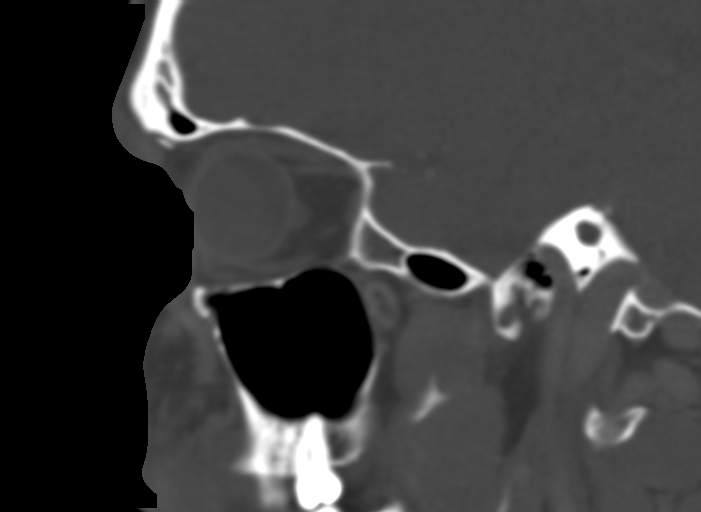
[im 43/85  bone]
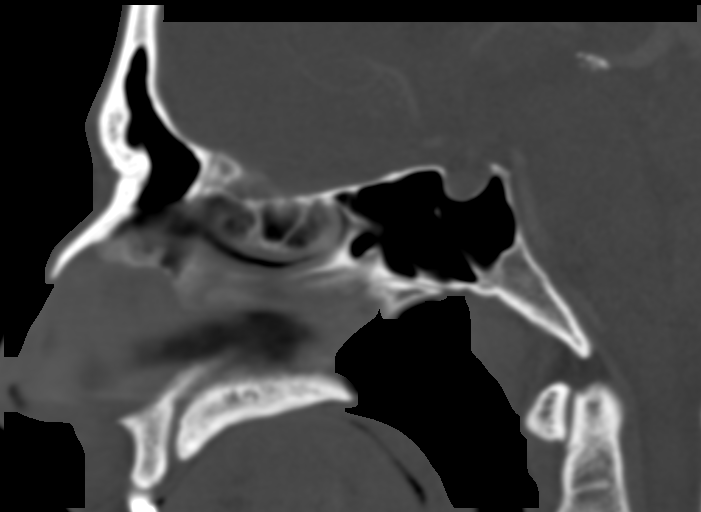
[im 57/85  bone]
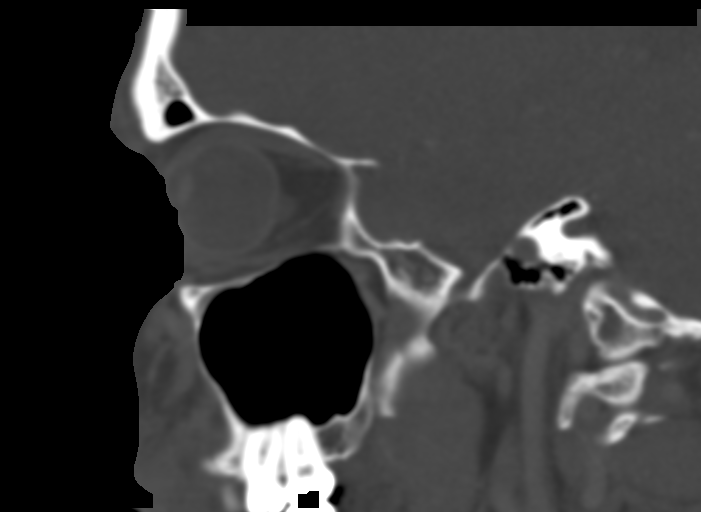

[14 of 47 positions shown; findings below may reference images not displayed]

FINDINGS: Orbits: No radiopaque glass foreign body in the orbit. There is a
small gas bubble along the lateral cornea on the left which could be
a corneal laceration. The lens is normal in position bilaterally.
The posterior chamber is normal without hemorrhage. No globe rupture
identified. Orbital soft tissues otherwise normal.

Visualized sinuses: Clear

Soft tissues: No significant soft tissue swelling or foreign body in
the face.

Limited intracranial: Negative
IMPRESSION: No radiopaque foreign body in the orbit or soft tissues

Small gas bubble along the anterior cornea laterally on the left
which could represent a corneal laceration.

## 2022-06-09 DIAGNOSIS — Z419 Encounter for procedure for purposes other than remedying health state, unspecified: Secondary | ICD-10-CM | POA: Diagnosis not present

## 2022-06-12 DIAGNOSIS — Z23 Encounter for immunization: Secondary | ICD-10-CM | POA: Diagnosis not present

## 2022-06-12 DIAGNOSIS — Z8639 Personal history of other endocrine, nutritional and metabolic disease: Secondary | ICD-10-CM | POA: Diagnosis not present

## 2022-06-12 DIAGNOSIS — Z9889 Other specified postprocedural states: Secondary | ICD-10-CM | POA: Diagnosis not present

## 2022-06-12 DIAGNOSIS — Z0000 Encounter for general adult medical examination without abnormal findings: Secondary | ICD-10-CM | POA: Diagnosis not present

## 2022-06-12 DIAGNOSIS — R7301 Impaired fasting glucose: Secondary | ICD-10-CM | POA: Diagnosis not present

## 2022-06-12 DIAGNOSIS — R030 Elevated blood-pressure reading, without diagnosis of hypertension: Secondary | ICD-10-CM | POA: Diagnosis not present

## 2022-06-13 DIAGNOSIS — R7301 Impaired fasting glucose: Secondary | ICD-10-CM | POA: Diagnosis not present

## 2022-06-13 DIAGNOSIS — Z0000 Encounter for general adult medical examination without abnormal findings: Secondary | ICD-10-CM | POA: Diagnosis not present

## 2022-07-10 DIAGNOSIS — Z419 Encounter for procedure for purposes other than remedying health state, unspecified: Secondary | ICD-10-CM | POA: Diagnosis not present

## 2022-07-11 ENCOUNTER — Telehealth: Payer: Self-pay

## 2022-07-11 NOTE — Telephone Encounter (Signed)
LVM. AS, CMA 

## 2022-07-14 DIAGNOSIS — R748 Abnormal levels of other serum enzymes: Secondary | ICD-10-CM | POA: Diagnosis not present

## 2022-08-10 DIAGNOSIS — Z419 Encounter for procedure for purposes other than remedying health state, unspecified: Secondary | ICD-10-CM | POA: Diagnosis not present

## 2022-09-08 DIAGNOSIS — Z419 Encounter for procedure for purposes other than remedying health state, unspecified: Secondary | ICD-10-CM | POA: Diagnosis not present

## 2022-09-25 DIAGNOSIS — R42 Dizziness and giddiness: Secondary | ICD-10-CM | POA: Diagnosis not present

## 2022-09-25 DIAGNOSIS — R7989 Other specified abnormal findings of blood chemistry: Secondary | ICD-10-CM | POA: Diagnosis not present

## 2022-09-25 DIAGNOSIS — I10 Essential (primary) hypertension: Secondary | ICD-10-CM | POA: Diagnosis not present

## 2022-09-25 DIAGNOSIS — E119 Type 2 diabetes mellitus without complications: Secondary | ICD-10-CM | POA: Diagnosis not present

## 2022-09-25 DIAGNOSIS — E559 Vitamin D deficiency, unspecified: Secondary | ICD-10-CM | POA: Diagnosis not present

## 2022-10-09 DIAGNOSIS — Z419 Encounter for procedure for purposes other than remedying health state, unspecified: Secondary | ICD-10-CM | POA: Diagnosis not present

## 2022-12-27 DIAGNOSIS — E119 Type 2 diabetes mellitus without complications: Secondary | ICD-10-CM | POA: Diagnosis not present

## 2022-12-27 DIAGNOSIS — E559 Vitamin D deficiency, unspecified: Secondary | ICD-10-CM | POA: Diagnosis not present

## 2022-12-27 DIAGNOSIS — I10 Essential (primary) hypertension: Secondary | ICD-10-CM | POA: Diagnosis not present

## 2022-12-28 DIAGNOSIS — E119 Type 2 diabetes mellitus without complications: Secondary | ICD-10-CM | POA: Diagnosis not present
